# Patient Record
Sex: Male | Born: 1975
Health system: Southern US, Community
[De-identification: ages and names within clinical notes are randomized; demographics above are authoritative.]

---

## 2011-04-16 ENCOUNTER — Emergency Department (INDEPENDENT_AMBULATORY_CARE_PROVIDER_SITE_OTHER): Payer: Managed Care, Other (non HMO)

## 2011-04-16 ENCOUNTER — Encounter: Payer: Self-pay | Admitting: *Deleted

## 2011-04-16 ENCOUNTER — Emergency Department (INDEPENDENT_AMBULATORY_CARE_PROVIDER_SITE_OTHER)
Admission: EM | Admit: 2011-04-16 | Discharge: 2011-04-16 | Disposition: A | Discharge status: Home / Self Care | Payer: Managed Care, Other (non HMO) | Source: Home / Self Care | Attending: Family Medicine | Admitting: Family Medicine

## 2011-04-16 DIAGNOSIS — S3992XA Unspecified injury of lower back, initial encounter: Secondary | ICD-10-CM

## 2011-04-16 DIAGNOSIS — S21209A Unspecified open wound of unspecified back wall of thorax without penetration into thoracic cavity, initial encounter: Secondary | ICD-10-CM

## 2011-04-16 MED ORDER — CHLORZOXAZONE 250 MG PO TABS: 250.0000 mg | ORAL_TABLET | Freq: Four times a day (QID) | ORAL | Status: AC | PRN

## 2011-04-16 MED ORDER — IBUPROFEN 800 MG PO TABS: 800.0000 mg | ORAL_TABLET | Freq: Three times a day (TID) | ORAL | Status: AC

## 2011-04-16 NOTE — ED Provider Notes (Addendum)
History     CSN: [id number] Arrival date & time: 04/16/2011  6:52 PM   First MD Initiated Contact with Patient 04/16/11 1818      Chief Complaint  Patient presents with  . Shoulder Pain    (Consider location/radiation/quality/duration/timing/severity/associated sxs/prior treatment) Patient is a 35 y.o. male presenting with shoulder pain. The history is provided by the patient.  Shoulder Pain This is a new problem. Episode onset: reaching overhead 2 mos ago, felt right upper back pain, given pain med by company md, sx continue worse last eve into neck. The problem has been gradually worsening. Exacerbated by: reaching overhead. The symptoms are relieved by nothing.    History reviewed. No pertinent past medical history.  History reviewed. No pertinent past surgical history.  Family History  Problem Relation Age of Onset  . Hypertension Father     History  Substance Use Topics  . Smoking status: Never Smoker   . Smokeless tobacco: Not on file  . Alcohol Use: Yes     Socially      Review of Systems  Allergies  Review of patient's allergies indicates no known allergies.  Home Medications   Current Outpatient Rx  Name Route Sig Dispense Refill  . CYCLOBENZAPRINE HCL 5 MG PO TABS Oral Take 5 mg by mouth 3 (three) times daily as needed.        BP 141/93  Pulse 76  Temp(Src) 98.5 F (36.9 C) (Oral)  Resp 18  SpO2 100%  Physical Exam  Constitutional: He is oriented to person, place, and time. He appears well-developed and well-nourished.  HENT:  Head: Normocephalic and atraumatic.  Neck: Normal range of motion. Neck supple.  Cardiovascular: Normal rate, regular rhythm, normal heart sounds and intact distal pulses.   Pulmonary/Chest: Effort normal and breath sounds normal.  Musculoskeletal: Normal range of motion. He exhibits tenderness.       Arms: Lymphadenopathy:    He has no cervical adenopathy.  Neurological: He is alert and oriented to person, place,  and time. He has normal strength. No sensory deficit.    ED Course  Procedures (including critical care time)  Labs Reviewed - No data to display No results found.   No diagnosis found.    MDM   X-rays reviewed and report per radiologist.        Barkley Bruns, MD 04/16/11 2041  Barkley Bruns, MD 04/16/11 2113

## 2011-04-16 NOTE — ED Notes (Signed)
Pt  Reports  Pain  r  Arm  /  Shoulder  Radiating  To  Neck     Pt  Reports  Prior  Injury  To  That  Area  But the  Pain  That  Radiates  toward  The  Neck  Started  Last pm     denys  Any  Recent  specfic   Injury        Pain worse  On movement

## 2011-08-15 ENCOUNTER — Emergency Department (HOSPITAL_COMMUNITY)
Admission: EM | Admit: 2011-08-15 | Discharge: 2011-08-15 | Disposition: A | Discharge status: Home / Self Care | Payer: Managed Care, Other (non HMO) | Attending: Emergency Medicine | Admitting: Emergency Medicine

## 2011-08-15 ENCOUNTER — Emergency Department (HOSPITAL_COMMUNITY): Payer: Managed Care, Other (non HMO)

## 2011-08-15 ENCOUNTER — Encounter (HOSPITAL_COMMUNITY): Payer: Self-pay | Admitting: *Deleted

## 2011-08-15 DIAGNOSIS — R51 Headache: Secondary | ICD-10-CM

## 2011-08-15 DIAGNOSIS — J019 Acute sinusitis, unspecified: Secondary | ICD-10-CM | POA: Insufficient documentation

## 2011-08-15 DIAGNOSIS — F17200 Nicotine dependence, unspecified, uncomplicated: Secondary | ICD-10-CM | POA: Insufficient documentation

## 2011-08-15 MED ORDER — HYDROCODONE-ACETAMINOPHEN 5-500 MG PO TABS: 1.0000 | ORAL_TABLET | Freq: Four times a day (QID) | ORAL | Status: AC | PRN

## 2011-08-15 MED ORDER — AZITHROMYCIN 250 MG PO TABS: 250.0000 mg | ORAL_TABLET | Freq: Every day | ORAL | Status: AC

## 2011-08-15 NOTE — ED Provider Notes (Addendum)
History     CSN: [id number]  Arrival date & time 08/15/11  1145   First MD Initiated Contact with Patient 08/15/11 1217      Chief Complaint  Patient presents with  . Headache    (Consider location/radiation/quality/duration/timing/severity/associated sxs/prior treatment) Patient is a 36 y.o. male presenting with headaches. The history is provided by the patient.  Headache  This is a new problem. Episode onset: 3 weeks ago. The problem occurs constantly. The problem has been gradually worsening. The headache is associated with nothing. The pain is located in the frontal region. The pain is moderate. The pain does not radiate. The treatment provided mild relief.    History reviewed. No pertinent past medical history.  History reviewed. No pertinent past surgical history.  Family History  Problem Relation Age of Onset  . Hypertension Father     History  Substance Use Topics  . Smoking status: Current Everyday Smoker    Types: Cigarettes  . Smokeless tobacco: Not on file  . Alcohol Use: Yes     Socially      Review of Systems  Neurological: Positive for headaches.  All other systems reviewed and are negative.    Allergies  Review of patient's allergies indicates no known allergies.  Home Medications   Current Outpatient Rx  Name Route Sig Dispense Refill  . IBUPROFEN 200 MG PO TABS Oral Take 200 mg by mouth every 6 (six) hours as needed. For headache      BP 116/81  Pulse 110  Temp(Src) 98.4 F (36.9 C) (Oral)  Resp 16  SpO2 95%  Physical Exam  Nursing note and vitals reviewed. Constitutional: He is oriented to person, place, and time. He appears well-developed and well-nourished. No distress.  HENT:  Head: Normocephalic and atraumatic.  Eyes: EOM are normal. Pupils are equal, round, and reactive to light.  Neck: Normal range of motion. Neck supple.  Cardiovascular: Normal rate and regular rhythm.   No murmur heard. Pulmonary/Chest: Effort normal  and breath sounds normal. No respiratory distress.  Abdominal: Soft. Bowel sounds are normal. He exhibits no distension. There is no tenderness.  Musculoskeletal: Normal range of motion. He exhibits no edema.  Lymphadenopathy:    He has no cervical adenopathy.  Neurological: He is alert and oriented to person, place, and time. No cranial nerve deficit. He exhibits normal muscle tone. Coordination normal.  Skin: Skin is warm and dry. He is not diaphoretic.    ED Course  Procedures (including critical care time)  Labs Reviewed - No data to display No results found.   No diagnosis found.    MDM    The CT looks okay with the exception of maxillary sinus mucosal thickening.  Will treat with antibiotics, pain meds.      Geoffery Lyons, MD 08/15/11 1241  Geoffery Lyons, MD 08/15/11 1344  Geoffery Lyons, MD 09/09/11 Ernestina [place]

## 2011-08-15 NOTE — ED Notes (Signed)
Pt reports having intermittent severe headache x 3 weeks, is temporarily relieved with ibuprofen, thought it was due to htn but bp is 116/81 at triage, no acute distress noted at triage.

## 2011-08-15 NOTE — Discharge Instructions (Signed)
 Headache, General, Unknown Cause   The specific cause of your headache may not have been found today. There are many causes and types of headache. A few common ones are:   Tension headache.   Migraine.   Infections (examples: dental and sinus infections).   Bone and/or joint problems in the neck or jaw.   Depression.   Eye problems.  These headaches are not life threatening.   Headaches can sometimes be diagnosed by a patient history and a physical exam. Sometimes, lab and imaging studies (such as x-ray and/or CT scan) are used to rule out more serious problems. In some cases, a spinal tap (lumbar puncture) may be requested. There are many times when your exam and tests may be normal on the first visit even when there is a serious problem causing your headaches. Because of that, it is very important to follow up with your doctor or local clinic for further evaluation.   FINDING OUT THE RESULTS OF TESTS   If a radiology test was performed, a radiologist will review your results.   You will be contacted by the emergency department or your physician if any test results require a change in your treatment plan.   Not all test results may be available during your visit. If your test results are not back during the visit, make an appointment with your caregiver to find out the results. Do not assume everything is normal if you have not heard from your caregiver or the medical facility. It is important for you to follow up on all of your test results.  HOME CARE INSTRUCTIONS   Keep follow-up appointments with your caregiver, or any specialist referral.   Only take over-the-counter or prescription medicines for pain, discomfort, or fever as directed by your caregiver.   Biofeedback, massage, or other relaxation techniques may be helpful.   Ice packs or heat applied to the head and neck can be used. Do this three to four times per day, or as needed.   Call your doctor if you have any questions or concerns.   If you smoke, you  should quit.  SEEK MEDICAL CARE IF:   You develop problems with medications prescribed.   You do not respond to or obtain relief from medications.   You have a change from the usual headache.   You develop nausea or vomiting.  SEEK IMMEDIATE MEDICAL CARE IF:   If your headache becomes severe.   You have an unexplained oral temperature above 102 F (38.9 C), or as your caregiver suggests.   You have a stiff neck.   You have loss of vision.   You have muscular weakness.   You have loss of muscular control.   You develop severe symptoms different from your first symptoms.   You start losing your balance or have trouble walking.   You feel faint or pass out.  MAKE SURE YOU:   Understand these instructions.   Will watch your condition.   Will get help right away if you are not doing well or get worse.  Document Released: 05/18/2005 Document Revised: 05/07/2011 Document Reviewed: 01/05/2008   [facility] Patient Information 2012 [place], [place].

## 2012-02-15 ENCOUNTER — Encounter (HOSPITAL_COMMUNITY): Payer: Self-pay

## 2012-02-15 ENCOUNTER — Emergency Department (HOSPITAL_COMMUNITY)
Admission: EM | Admit: 2012-02-15 | Discharge: 2012-02-16 | Disposition: A | Discharge status: Home Health | Payer: Self-pay | Attending: Emergency Medicine | Admitting: Emergency Medicine

## 2012-02-15 DIAGNOSIS — S39012A Strain of muscle, fascia and tendon of lower back, initial encounter: Secondary | ICD-10-CM

## 2012-02-15 DIAGNOSIS — F17200 Nicotine dependence, unspecified, uncomplicated: Secondary | ICD-10-CM | POA: Insufficient documentation

## 2012-02-15 DIAGNOSIS — W1789XA Other fall from one level to another, initial encounter: Secondary | ICD-10-CM | POA: Insufficient documentation

## 2012-02-15 DIAGNOSIS — S339XXA Sprain of unspecified parts of lumbar spine and pelvis, initial encounter: Secondary | ICD-10-CM | POA: Insufficient documentation

## 2012-02-15 MED ORDER — OXYCODONE-ACETAMINOPHEN 5-325 MG PO TABS
1.0000 | ORAL_TABLET | Freq: Once | ORAL | Status: AC
  Administered 2012-02-15: 1 via ORAL
  Filled 2012-02-15: qty 1

## 2012-02-15 MED ORDER — DIAZEPAM 5 MG PO TABS
5.0000 mg | ORAL_TABLET | Freq: Once | ORAL | Status: AC
  Administered 2012-02-15: 5 mg via ORAL
  Filled 2012-02-15: qty 1

## 2012-02-15 NOTE — ED Notes (Signed)
Pt reports lower back pain x2 weeks, pt reports after pain started he fell off of a pallet jack landing on his buttocks. Pt denies problems w/bowel or bladder function

## 2012-02-15 NOTE — ED Provider Notes (Signed)
History     CSN: [id number]  Arrival date & time 02/15/12  2115   First MD Initiated Contact with Patient 02/15/12 2340      Chief Complaint  Patient presents with  . Back Pain    (Consider location/radiation/quality/duration/timing/severity/associated sxs/prior treatment) HPI Comments: Patient states, that 2 weeks, ago.  He fell off a [employer]product [employer]" falling onto his buttock.  He has had pain in his lumbar spine and right flank area.  Ever, since he's been taking over-the-counter ibuprofen, 6-800 mg 3-4 times a day without any relief  Patient is a 36 y.o. male presenting with back pain. The history is provided by the patient.  Back Pain  This is a new problem. The current episode started more than 1 week ago. The problem occurs constantly. The problem has not changed since onset.The pain is associated with falling. The pain is present in the lumbar spine. The quality of the pain is described as aching. The pain does not radiate. The pain is at a severity of 9/10. The pain is moderate. The symptoms are aggravated by bending, twisting and certain positions. Pertinent negatives include no fever, no numbness, no headaches, no abdominal pain, no dysuria, no leg pain and no weakness.    History reviewed. No pertinent past medical history.  History reviewed. No pertinent past surgical history.  Family History  Problem Relation Age of Onset  . Hypertension Father     History  Substance Use Topics  . Smoking status: Current Every Day Smoker    Types: Cigarettes  . Smokeless tobacco: Not on file  . Alcohol Use: Yes     Socially      Review of Systems  Constitutional: Negative for fever and chills.  HENT: Negative for neck pain.   Gastrointestinal: Negative for abdominal pain.  Genitourinary: Negative for dysuria.  Musculoskeletal: Positive for back pain.  Skin: Negative for rash and wound.  Neurological: Negative for dizziness, weakness, numbness and headaches.    Allergies    Review of patient's allergies indicates no known allergies.  Home Medications   Current Outpatient Rx  Name Route Sig Dispense Refill  . IBUPROFEN 800 MG PO TABS Oral Take 800 mg by mouth daily as needed. For pain    . DIAZEPAM 5 MG PO TABS Oral Take 1 tablet (5 mg total) by mouth every 8 (eight) hours as needed for anxiety. 30 tablet 0  . HYDROCODONE-ACETAMINOPHEN 5-500 MG PO TABS Oral Take 1 tablet by mouth every 6 (six) hours as needed for pain. 15 tablet 0    BP 138/85  Pulse 107  Temp 98.4 F (36.9 C) (Oral)  Resp 18  SpO2 97%  Physical Exam  Constitutional: He appears well-developed and well-nourished.  HENT:  Head: Normocephalic.  Eyes: Pupils are equal, round, and reactive to light.  Cardiovascular: Normal rate.   Musculoskeletal: He exhibits no edema.       Lumbar back: He exhibits decreased range of motion, tenderness and pain. He exhibits no bony tenderness and no swelling.       Back:  Neurological: He is alert.  Skin: Skin is warm.    ED Course  Procedures (including critical care time)  Labs Reviewed - No data to display Dg Lumbar Spine Complete  02/16/2012  *RADIOLOGY REPORT*  Clinical Data: Low back pain for 2 weeks.  LUMBAR SPINE - COMPLETE 4+ VIEW  Comparison: None.  Findings: Vertebral body height and alignment are normal.  No pars particulars defect is identified.  Intervertebral disc space height is maintained.  Paraspinous structures unremarkable.  IMPRESSION: Negative study.   Original Report Authenticated By: Bernadene Bell. D'ALESSIO, M.D.      1. Strain, lumbosacral       MDM   We'll x-ray looking for potential, compression fracture, although this is highly unlikely.  Most likely a muscle strain in the lumbosacral area  Patient states, that Valium is ineffective for him.  He would rather have Flexeril       Arman Filter, NP 02/16/12 0034  Arman Filter, NP 02/16/12 0034  Arman Filter, NP 02/16/12 0045

## 2012-02-16 ENCOUNTER — Emergency Department (HOSPITAL_COMMUNITY): Payer: Self-pay

## 2012-02-16 MED ORDER — DIAZEPAM 5 MG PO TABS: 5.0000 mg | ORAL_TABLET | Freq: Three times a day (TID) | ORAL | Status: DC | PRN

## 2012-02-16 MED ORDER — HYDROCODONE-ACETAMINOPHEN 5-500 MG PO TABS: 1.0000 | ORAL_TABLET | Freq: Four times a day (QID) | ORAL | Status: DC | PRN

## 2012-02-16 MED ORDER — CYCLOBENZAPRINE HCL 10 MG PO TABS: 5.0000 mg | ORAL_TABLET | Freq: Two times a day (BID) | ORAL | Status: DC | PRN

## 2012-02-16 NOTE — ED Notes (Signed)
Pt refused wheelchair for discharge. Pt discharged home in good condition.

## 2012-03-01 ENCOUNTER — Encounter (HOSPITAL_COMMUNITY): Payer: Self-pay | Admitting: *Deleted

## 2012-03-01 ENCOUNTER — Emergency Department (HOSPITAL_COMMUNITY): Payer: Self-pay

## 2012-03-01 ENCOUNTER — Emergency Department (HOSPITAL_COMMUNITY)
Admission: EM | Admit: 2012-03-01 | Discharge: 2012-03-01 | Disposition: A | Discharge status: Home / Self Care | Payer: Self-pay | Attending: Emergency Medicine | Admitting: Emergency Medicine

## 2012-03-01 DIAGNOSIS — W108XXA Fall (on) (from) other stairs and steps, initial encounter: Secondary | ICD-10-CM | POA: Insufficient documentation

## 2012-03-01 DIAGNOSIS — S63509A Unspecified sprain of unspecified wrist, initial encounter: Secondary | ICD-10-CM | POA: Insufficient documentation

## 2012-03-01 DIAGNOSIS — W19XXXA Unspecified fall, initial encounter: Secondary | ICD-10-CM

## 2012-03-01 DIAGNOSIS — Y92009 Unspecified place in unspecified non-institutional (private) residence as the place of occurrence of the external cause: Secondary | ICD-10-CM | POA: Insufficient documentation

## 2012-03-01 DIAGNOSIS — F17200 Nicotine dependence, unspecified, uncomplicated: Secondary | ICD-10-CM | POA: Insufficient documentation

## 2012-03-01 MED ORDER — OXYCODONE-ACETAMINOPHEN 5-325 MG PO TABS
1.0000 | ORAL_TABLET | Freq: Once | ORAL | Status: AC
  Administered 2012-03-01: 1 via ORAL
  Filled 2012-03-01: qty 1

## 2012-03-01 MED ORDER — IBUPROFEN 800 MG PO TABS
800.0000 mg | ORAL_TABLET | Freq: Once | ORAL | Status: AC
  Administered 2012-03-01: 800 mg via ORAL
  Filled 2012-03-01: qty 1

## 2012-03-01 MED ORDER — IBUPROFEN 600 MG PO TABS: 600.0000 mg | ORAL_TABLET | Freq: Four times a day (QID) | ORAL | Status: DC | PRN

## 2012-03-01 NOTE — ED Notes (Signed)
Pt fell forward last night and caught weight  on rt wrist.  Pt claims wrist was swollen and he has been applying ice.  Pt states his palm is also swollen.  Rn noted decreased ROM.  Pt alert oriented X4

## 2012-03-01 NOTE — ED Provider Notes (Signed)
History     CSN: [id number]  Arrival date & time 03/01/12  7829   First MD Initiated Contact with Patient 03/01/12 1004      Chief Complaint  Patient presents with  . Wrist Pain    (Consider location/radiation/quality/duration/timing/severity/associated sxs/prior treatment) HPI [place] Looney is a 36 y.o. male who was walking up the stairs to his home last night fell on an outstretched hand and injured his right wrist. He said some minor swelling to the wrist, 10/10 pain, sharp, does not radiate, he's had good sensation in the hand he has not had any weakness in the hand. Did not sustain any lacerations.    History reviewed. No pertinent past medical history.  History reviewed. No pertinent past surgical history.  Family History  Problem Relation Age of Onset  . Hypertension Father     History  Substance Use Topics  . Smoking status: Current Every Day Smoker    Types: Cigarettes  . Smokeless tobacco: Not on file  . Alcohol Use: Yes     Socially      Review of Systems At least 10pt or greater review of systems completed and are negative except where specified in the HPI.  Allergies  Review of patient's allergies indicates no known allergies.  Home Medications   Current Outpatient Rx  Name Route Sig Dispense Refill  . CYCLOBENZAPRINE HCL 10 MG PO TABS Oral Take 10 mg by mouth 3 (three) times daily as needed. Back spasms    . HYDROCODONE-ACETAMINOPHEN 5-500 MG PO TABS Oral Take 1 tablet by mouth every 6 (six) hours as needed for pain. 15 tablet 0  . IBUPROFEN 600 MG PO TABS Oral Take 600 mg by mouth every 6 (six) hours as needed. pain    . IBUPROFEN 600 MG PO TABS Oral Take 1 tablet (600 mg total) by mouth every 6 (six) hours as needed for pain. 30 tablet 0    BP 129/79  Pulse 105  Temp 98.2 F (36.8 C) (Oral)  Resp 18  SpO2 95%  Physical Exam  Nursing notes reviewed.  Electronic medical record reviewed. VITAL SIGNS:   Filed Vitals:   03/01/12 0951  BP:  129/79  Pulse: 105  Temp: 98.2 F (36.8 C)  TempSrc: Oral  Resp: 18  SpO2: 95%   CONSTITUTIONAL: Awake, oriented, appears non-toxic HENT: Atraumatic, normocephalic, oral mucosa pink and moist, airway patent. Nares patent without drainage. External ears normal. EYES: Conjunctiva clear, EOMI, PERRLA NECK: Trachea midline, non-tender, supple CARDIOVASCULAR: Normal heart rate, Normal rhythm, No murmurs, rubs, gallops PULMONARY/CHEST: Clear to auscultation, no rhonchi, wheezes, or rales. Symmetrical breath sounds. Non-tender. ABDOMINAL: Non-distended, soft, non-tender - no rebound or guarding.  BS normal. NEUROLOGIC: Non-focal, moving all four extremities, no gross sensory or motor deficits. EXTREMITIES: No clubbing, cyanosis, or edema. Mild swelling to right wrist, mildly tender to palpation, no anatomic snuffbox tenderness. Patient has 5 out of 5 strength to grip, abduction and adduction. Patient has limited range of motion at wrist secondary flexion and extension due to pain. SKIN: Warm, Dry, No erythema, No rash  ED Course  Procedures (including critical care time)  Labs Reviewed - No data to display Dg Wrist Complete Right  03/01/2012  *RADIOLOGY REPORT*  Clinical Data: Pain post fall  RIGHT WRIST - COMPLETE 3+ VIEW  Comparison: None.  Findings: Four views of the right wrist submitted.  No acute fracture or subluxation.  No radiopaque foreign body.  IMPRESSION: No acute fracture or subluxation.   Original  Report Authenticated By: Natasha Mead, M.D.     1. Wrist sprain   2. Fall     MDM  [place] Biever is a 36 y.o. male presenting with a right wrist injury after falling last night. X-ray obtained in triage shows no acute fracture. I'm not concerned for a Occult scaphoid/carpal fracture at this time as he has no snuff box tenderness in most of his pain is over distal radius.  Patient be given pain medicine and discharged home in good condition.          Jones Skene, MD 03/03/12  1610

## 2012-03-01 NOTE — Progress Notes (Signed)
Orthopedic Tech Progress Note Patient Details:  Bryan Cobb Dec 24, 1975 [id number]  Ortho Devices Type of Ortho Device: Velcro wrist splint Ortho Device/Splint Location: right velcro wrist splint Ortho Device/Splint Interventions: Application   Cammer, Mickie Bail 03/01/2012, 10:44 AM

## 2012-03-01 NOTE — ED Notes (Signed)
Pt states fell last nite and injured right wrist

## 2012-09-23 ENCOUNTER — Emergency Department (HOSPITAL_COMMUNITY)
Admission: EM | Admit: 2012-09-23 | Discharge: 2012-09-23 | Discharge status: Against Medical Advice | Payer: Self-pay | Attending: Emergency Medicine | Admitting: Emergency Medicine

## 2012-09-23 ENCOUNTER — Encounter (HOSPITAL_COMMUNITY): Payer: Self-pay | Admitting: *Deleted

## 2012-09-23 DIAGNOSIS — F17200 Nicotine dependence, unspecified, uncomplicated: Secondary | ICD-10-CM | POA: Insufficient documentation

## 2012-09-23 DIAGNOSIS — M25559 Pain in unspecified hip: Secondary | ICD-10-CM | POA: Insufficient documentation

## 2012-09-23 NOTE — ED Notes (Signed)
Pt c/o extreme hip pain that has been worsening x 3 weeks.  States he had a forklift injury, no broken bones.  Has been going to physical therapy.  C/o left "buttocks and hip".  Ambulatory, hurts to sit.

## 2013-05-15 ENCOUNTER — Encounter (HOSPITAL_COMMUNITY): Payer: Self-pay | Admitting: Emergency Medicine

## 2013-05-15 ENCOUNTER — Emergency Department (HOSPITAL_COMMUNITY)
Admission: EM | Admit: 2013-05-15 | Discharge: 2013-05-15 | Disposition: A | Discharge status: Home / Self Care | Payer: Managed Care, Other (non HMO) | Attending: Emergency Medicine | Admitting: Emergency Medicine

## 2013-05-15 ENCOUNTER — Emergency Department (HOSPITAL_COMMUNITY): Payer: Managed Care, Other (non HMO)

## 2013-05-15 DIAGNOSIS — F17200 Nicotine dependence, unspecified, uncomplicated: Secondary | ICD-10-CM | POA: Insufficient documentation

## 2013-05-15 DIAGNOSIS — R072 Precordial pain: Secondary | ICD-10-CM | POA: Insufficient documentation

## 2013-05-15 DIAGNOSIS — R079 Chest pain, unspecified: Secondary | ICD-10-CM

## 2013-05-15 LAB — CBC
MCHC: 35.6 g/dL (ref 30.0–36.0)
Platelets: 266 10*3/uL (ref 150–400)
RDW: 11.9 % (ref 11.5–15.5)
WBC: 5.7 10*3/uL (ref 4.0–10.5)

## 2013-05-15 LAB — BASIC METABOLIC PANEL
Chloride: 102 mEq/L (ref 96–112)
GFR calc Af Amer: >90 mL/min (ref >90)
GFR calc non Af Amer: 86 mL/min — ABNORMAL LOW (ref >90)
Potassium: 3.3 mEq/L — ABNORMAL LOW (ref 3.5–5.1)
Sodium: 140 mEq/L (ref 135–145)

## 2013-05-15 LAB — TROPONIN I: Troponin I: <0.3 ng/mL

## 2013-05-15 MED ORDER — POTASSIUM CHLORIDE CRYS ER 20 MEQ PO TBCR
40.0000 meq | EXTENDED_RELEASE_TABLET | Freq: Once | ORAL | Status: AC
  Administered 2013-05-15: 40 meq via ORAL
  Filled 2013-05-15: qty 2

## 2013-05-15 MED ORDER — ACETAMINOPHEN 325 MG PO TABS
650.0000 mg | ORAL_TABLET | Freq: Once | ORAL | Status: AC
  Administered 2013-05-15: 650 mg via ORAL
  Filled 2013-05-15: qty 2

## 2013-05-15 NOTE — ED Provider Notes (Signed)
CSN: [id number]     Arrival date & time 05/15/13  1714 History   None    Chief Complaint  Patient presents with  . Chest Pain   (Consider location/radiation/quality/duration/timing/severity/associated sxs/prior Treatment) HPI Mr. Bryan Cobb is a 37 y.o. y/o male w/ no significant PMHx, presents to the ED w/ complaints of chest pain. The patient claims this pain started yesterday, describes as substernal/epigastric pain, sharp in nature, 7/10 in severity, and worse with movement and deep inspiration. Reproducible w/ palpation. He claims this pain started in his sleep and denies any associated SOB, nausea, vomiting, fever, chills, cough, sore throat, dizziness, lightheadedness, diaphoresis, and palpitations. The patient also denies any recent prolonged immobilization, recent surgeries, or previous history of DVT or PE.  History reviewed. No pertinent past medical history. History reviewed. No pertinent past surgical history. Family History  Problem Relation Age of Onset  . Hypertension Father    History  Substance Use Topics  . Smoking status: Current Every Day Smoker -- 0.25 packs/day    Types: Cigarettes  . Smokeless tobacco: Not on file  . Alcohol Use: Yes     Comment: Socially    Review of Systems General: Denies fever, chills, diaphoresis, appetite change and fatigue.  Respiratory: Denies SOB, DOE, cough, chest tightness, and wheezing.   Cardiovascular: Positive for chest pain. Denies palpitations and leg swelling.  Gastrointestinal: Denies nausea, vomiting, abdominal pain, diarrhea, constipation, blood in stool and abdominal distention.  Genitourinary: Denies dysuria, urgency, frequency, hematuria, flank pain and difficulty urinating.  Endocrine: Denies hot or cold intolerance, sweats, polyuria, polydipsia. Musculoskeletal: Denies myalgias, back pain, joint swelling, arthralgias and gait problem.  Skin: Denies pallor, rash and wounds.  Neurological: Denies dizziness,  seizures, syncope, weakness, lightheadedness, numbness and headaches.  Psychiatric/Behavioral: Denies mood changes, confusion, nervousness, sleep disturbance and agitation.  Allergies  Review of patient's allergies indicates no known allergies.  Home Medications   Current Outpatient Rx  Name  Route  Sig  Dispense  Refill  . traMADol (ULTRAM) 50 MG tablet   Oral   Take 50 mg by mouth every 6 (six) hours as needed.          Physical Exam Filed Vitals:   05/15/13 1722 05/15/13 2128  BP: 144/90 144/82  Pulse: 94 75  Temp: 98.1 F (36.7 C) 98.1 F (36.7 C)  TempSrc: Oral Oral  Resp: 16 17  Height: 5\' 9"  (1.753 m)   Weight: 186 lb 1.6 oz (84.414 kg)   SpO2: 97% 100%  General: Vital signs reviewed.  Patient is a well-developed and well-nourished, in no acute distress and cooperative with exam. Alert and oriented x3.  Head: Normocephalic and atraumatic. Eyes: PERRL, EOMI, conjunctivae normal, No scleral icterus.  Neck: Supple, trachea midline, normal ROM, No JVD, masses, thyromegaly, or carotid bruit present.  Cardiovascular: RRR, S1 normal, S2 normal, no murmurs, gallops, or rubs. Pulmonary/Chest: Normal respiratory effort, CTAB, no wheezes, rales, or rhonchi. Mid-sternal chest pain reproducible w/ palpation. Abdominal: Soft, non-tender, non-distended, bowel sounds are normal, no masses, organomegaly, or guarding present.  Musculoskeletal: No joint deformities, erythema, or stiffness, ROM full and no nontender. Extremities: No swelling or edema,  pulses symmetric and intact bilaterally. No cyanosis or clubbing. Neurological: A&O x3, Strength is normal and symmetric bilaterally, cranial nerve II-XII are grossly intact, no focal motor deficit, sensory intact to light touch bilaterally.  Skin: Warm, dry and intact. No rashes or erythema. Psychiatric: Normal mood and affect. speech and behavior is normal. Cognition and memory  are normal.    ED Course  Procedures (including critical  care time) Labs Review Labs Reviewed  CBC - Abnormal; Notable for the following:    HCT 38.8 (*)    All other components within normal limits  BASIC METABOLIC PANEL - Abnormal; Notable for the following:    Potassium 3.3 (*)    GFR calc non Af Amer 86 (*)    All other components within normal limits  TROPONIN I   Imaging Review Dg Chest 2 View  05/15/2013   CLINICAL DATA:  Tightness in chest.  EXAM: CHEST  2 VIEW  COMPARISON:  04/16/2011.  FINDINGS: Mediastinum and hilar structures are normal. Heart size and pulmonary vascularity normal. Platelike atelectasis both lung bases. No pleural effusion or pneumothorax. No acute bony abnormality.  IMPRESSION: Platelike atelectasis both lung bases. Followup chest x-rays to demonstrate clearing suggested .   Electronically Signed   By: Maisie Fus  Register   On: 05/15/2013 18:15    EKG Interpretation   None       MDM   Mr. Bryan Cobb is a 37 y.o. y/o male w/ no significant PMHx, presents to the ED w/ complaints of chest pain. Unlikely ACS at this time given atypical presentation and age. Pain is reproducible w/ palpation, worse w/ deep inspiration and movement. No recent history of immobilization, recent surgery, or history of DVT/PE. No tachycardia. Well's Criteria and Revised Geneva score low risk for PE. No fever or chills, or cough, pneumonia unlikely. Also considered pancreatitis, GERD, and PUD, however, patient denies a significant history alcohol use and pain is unrelated to food intake. No history of NSAID use.  -EKG shows NSR @ 95, non-specific ST/T wave abnormalities.  -Troponin x1 -ve -CBC wnl -BMET w/ K of 3.3; give K-dur 40 mEq -CXR shows a platelike atelectasis both lung bases, unchanged from previous CXR. Suggest follow up in future to determine if clearing.   Patient stable for discharge at this time. Advised patient to [facility] to establish PCP in 2-4 weeks.    Courtney Paris,  MD 05/15/13 2248

## 2013-05-15 NOTE — ED Notes (Signed)
PT states yesterday he awoke out of his sleep with chest pain.  Denies nausea, diaphoresis or sob.  Denies injury or change in normal behaviors.  Denies cough but feels as if he's wheezing.  Pain increases when he leans forward or lays on his back or side.  Pain also increases when he laughs.

## 2013-05-18 NOTE — ED Provider Notes (Signed)
I saw and evaluated the patient, reviewed the resident's note and I agree with the findings and plan.   .Face to face Exam:  General:  Awake HEENT:  Atraumatic Resp:  Normal effort Abd:  Nondistended Neuro:No focal weakness  Pain had been constant and is reproducible.Low likelyhood of ACS in light of hx, ecg and negative troponin.   I reviewed the EKG and agree with interpretation in chart  Nelia Shi, MD 05/18/13 1056

## 2014-12-04 ENCOUNTER — Encounter (HOSPITAL_COMMUNITY): Payer: Self-pay | Admitting: Emergency Medicine

## 2014-12-04 ENCOUNTER — Emergency Department (HOSPITAL_COMMUNITY)
Admission: EM | Admit: 2014-12-04 | Discharge: 2014-12-04 | Disposition: A | Discharge status: Home / Self Care | Payer: 59 | Attending: Emergency Medicine | Admitting: Emergency Medicine

## 2014-12-04 ENCOUNTER — Emergency Department (HOSPITAL_COMMUNITY): Payer: 59

## 2014-12-04 DIAGNOSIS — Y9367 Activity, basketball: Secondary | ICD-10-CM | POA: Diagnosis not present

## 2014-12-04 DIAGNOSIS — Y999 Unspecified external cause status: Secondary | ICD-10-CM | POA: Insufficient documentation

## 2014-12-04 DIAGNOSIS — Y929 Unspecified place or not applicable: Secondary | ICD-10-CM | POA: Insufficient documentation

## 2014-12-04 DIAGNOSIS — M20011 Mallet finger of right finger(s): Secondary | ICD-10-CM | POA: Insufficient documentation

## 2014-12-04 DIAGNOSIS — W1839XA Other fall on same level, initial encounter: Secondary | ICD-10-CM | POA: Diagnosis not present

## 2014-12-04 DIAGNOSIS — S6991XA Unspecified injury of right wrist, hand and finger(s), initial encounter: Secondary | ICD-10-CM | POA: Diagnosis present

## 2014-12-04 MED ORDER — HYDROCODONE-ACETAMINOPHEN 5-325 MG PO TABS: 2.0000 | ORAL_TABLET | ORAL | Status: DC | PRN

## 2014-12-04 MED ORDER — IBUPROFEN 800 MG PO TABS: 800.0000 mg | ORAL_TABLET | Freq: Three times a day (TID) | ORAL | Status: DC | PRN

## 2014-12-04 NOTE — Discharge Instructions (Signed)
Read the information below.  Use the prescribed medication as directed.  Please discuss all new medications with your pharmacist.  Do not take additional tylenol while taking the prescribed pain medication to avoid overdose.  You may return to the Emergency Department at any time for worsening condition or any new symptoms that concern you.   If you develop uncontrolled pain, weakness or numbness of the extremity, severe discoloration of the skin, or you are unable to move your finger, return to the ER for a recheck.      Mallet Finger A mallet, or jammed, finger occurs when the end of a straightened finger or thumb receives a blow (often from a ball). This causes a disruption (tearing) of the extensor tendon (cord like structure which attaches muscle to bone) that straightens the end of your finger. The last joint in your finger will droop and you cannot extend it. Sometimes this is associated with a small fracture (break in bone) of the base of the end bone (phalanx) in your finger. It usually takes 4 to 5 weeks to heal. HOME CARE INSTRUCTIONS   Apply ice to the sore finger for 15-20 minutes, 03-04 times per day for 2 days. Put the ice in a plastic bag and place a towel between the bag of ice and your skin.  If you have a finger splint, wear your splint as directed.  You may remove the splint to wash your finger or as directed.  If your splint is off, do not try to bend the tip of your finger.  Put your splint back on as soon as possible. If your finger is numb or tingling, the splint is probably too tight. You can loosen it so it is comfortable.  Move the part of your injured finger that is not covered by the splint several times a day.  Take medications as directed by your caregiver. Only take over-the-counter or prescription medicines for pain, discomfort, or fever as directed by your caregiver.  IMPORTANT: follow up with your caregiver or keep or call for any appointments with specialists  as directed. The failure to follow up could result in chronic pain and / or disability. SEEK MEDICAL CARE IF:   You have increased pain or swelling.  You notice coldness of your finger.  After treatment you still cannot extend your finger. SEEK IMMEDIATE MEDICAL CARE IF:  Your finger is swollen and very red, white, blue, numb, cold, or tingling. MAKE SURE YOU:   Understand these instructions.  Will watch your condition.  Will get help right away if you are not doing well or get worse. Document Released: 05/15/2000 Document Revised: 10/02/2013 Document Reviewed: 12/30/2007 [facility] Patient Information 2015 [place], [place]. This information is not intended to replace advice given to you by your health care provider. Make sure you discuss any questions you have with your health care provider.

## 2014-12-04 NOTE — ED Provider Notes (Signed)
CSN: [id number]     Arrival date & time 12/04/14  1630 History   This chart was scribed for Trixie DredgeEmily Emmilynn Marut, PA-C working with Arby BarretteMarcy Pfeiffer, MD by Elveria Risingimelie Horne, ED Scribe. This patient was seen in room WTR7/WTR7 and the patient's care was started at 5:32 PM.   Chief Complaint  Patient presents with  . Finger Injury    5th finger swollen after injury while playing basketball  . Back Pain    low back pain after falling    The history is provided by the patient. No language interpreter was used.   HPI Comments: Bryan Cobb is a 39 y.o. male who presents to the Emergency Department with multiple injuries sustained due falling during basketball game last night. Patient reports falling after jumping up to retrieve a rebound and landing with impact to lower back and he put his right hand back behind him to catch himself during the fall.. Patient denies striking his head or loss of consciousness. Patient complains of right fifth finger pain and swelling and lower back pain. Patient reports treatment with ibuprofen at work today.   Denies weakness or numbness, other injury.  Back pain is described as a mild soreness.  Pt operates heavy machinery at work.      History reviewed. No pertinent past medical history. History reviewed. No pertinent past surgical history. Family History  Problem Relation Age of Onset  . Hypertension Father    History  Substance Use Topics  . Smoking status: Never Smoker   . Smokeless tobacco: Not on file  . Alcohol Use: No    Review of Systems  Constitutional: Negative for fever and chills.  Cardiovascular: Negative for chest pain.  Gastrointestinal: Negative for abdominal pain.  Musculoskeletal: Positive for myalgias, back pain and arthralgias.  Skin: Negative for wound.  Allergic/Immunologic: Negative for immunocompromised state.  Neurological: Negative for syncope, weakness, numbness and headaches.  Hematological: Does not bruise/bleed easily.   Psychiatric/Behavioral: Positive for self-injury (accidental).      Allergies  Review of patient's allergies indicates no known allergies.  Home Medications   Prior to Admission medications   Medication Sig Start Date End Date Taking? Authorizing Provider  traMADol (ULTRAM) 50 MG tablet Take 50 mg by mouth every 6 (six) hours as needed.    Historical Provider, MD   Triage Vitals: BP 148/88 mmHg  Pulse 80  Temp(Src) 98.3 F (36.8 C) (Oral)  Resp 18  SpO2 100% Physical Exam  Constitutional: He appears well-developed and well-nourished. No distress.  HENT:  Head: Normocephalic and atraumatic.  Neck: Neck supple.  Pulmonary/Chest: Effort normal.  Musculoskeletal:  Spine nontender, no crepitus, or stepoffs. Right hand: fifth finger with tenderness, edema, mild erythema over the DIP. Held in slightly flexed position unable to full extend at DIP only. Full flexion of finger. Distal sensation intact. Cap refill <2 seconds.   Neurological: He is alert.  Skin: He is not diaphoretic.  Nursing note and vitals reviewed.   ED Course  Procedures (including critical care time)  COORDINATION OF CARE: 5:38 PM- Discussed treatment plan with patient at bedside and patient agreed to plan.   Labs Review Labs Reviewed - No data to display  Imaging Review Dg Hand Complete Right  12/04/2014   CLINICAL DATA:  Injury to the right fifth finger while playing basketball. Slight deformity at the distal interphalangeal joint. Initial encounter.  EXAM: RIGHT HAND - COMPLETE 3+ VIEW  COMPARISON:  Right wrist radiographs performed 03/01/2012  FINDINGS: There is  no evidence of fracture or dislocation. The joint spaces are preserved. The carpal rows are intact, and demonstrate normal alignment. The soft tissues are unremarkable in appearance.  IMPRESSION: No evidence of fracture or dislocation.   Electronically Signed   By: Roanna Raider M.D.   On: 12/04/2014 17:31     EKG Interpretation None       MDM   Final diagnoses:  Mallet finger of right hand   Afebrile, nontoxic patient with injury to his right 5th finger while falling backwards playing basketball.  Unable to fully extend 5th DIP joint.   Xray negative.  No skin opening.  Suspect mallet finger.  Placed in splint and advised to leave it on at all times.   D/C home with ibuprofen, norco, hand surgery follow up.   Discussed result, findings, treatment, and follow up  with patient.  Pt given return precautions.  Pt verbalizes understanding and agrees with plan.      I personally performed the services described in this documentation, which was scribed in my presence. The recorded information has been reviewed and is accurate.    Trixie Dredge, PA-C 12/04/14 1824  Arby Barrette, MD 12/05/14 [phone]

## 2014-12-04 NOTE — ED Notes (Signed)
Pt c/o low back pain since he fell while playing basketball last night. R/hand injury-5th finger swollen, injured during game

## 2015-09-30 ENCOUNTER — Ambulatory Visit: Payer: Self-pay

## 2015-09-30 ENCOUNTER — Other Ambulatory Visit: Payer: Self-pay | Admitting: Occupational Medicine

## 2015-09-30 DIAGNOSIS — Z0000 Encounter for general adult medical examination without abnormal findings: Secondary | ICD-10-CM

## 2016-07-14 IMAGING — CR DG HAND COMPLETE 3+V*R*
3 series · 3 of 3 positions shown · non-contrast
Comparison: Right wrist radiographs performed 03/01/2012

CLINICAL DATA: Injury to the right fifth finger while playing
basketball. Slight deformity at the distal interphalangeal joint.
Initial encounter.

EXAM:
RIGHT HAND - COMPLETE 3+ VIEW

[x hand pa right]
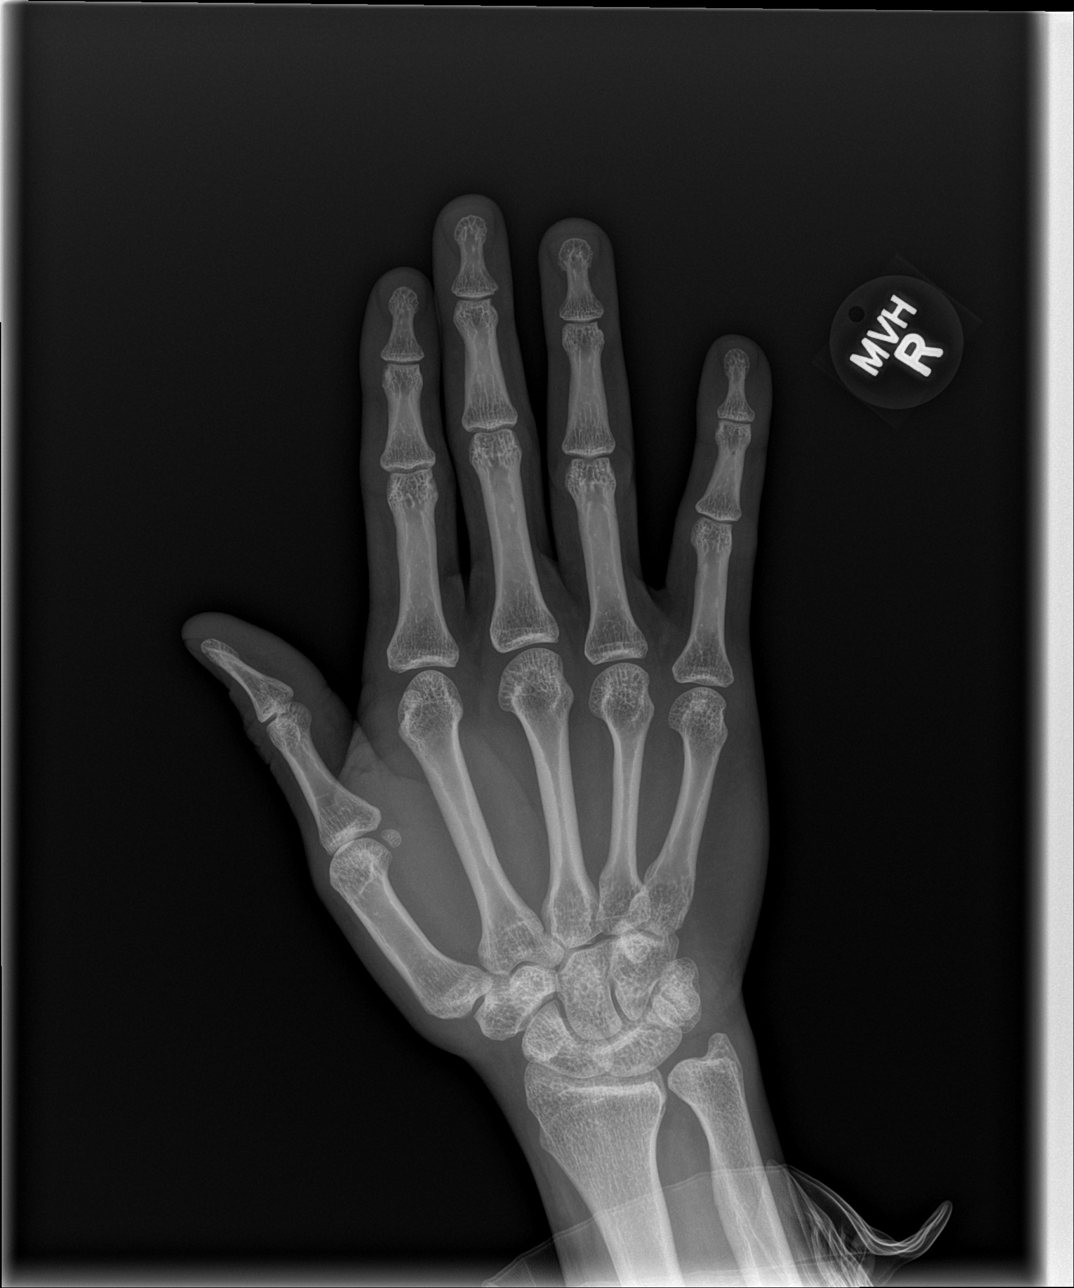

[x hand obl right]
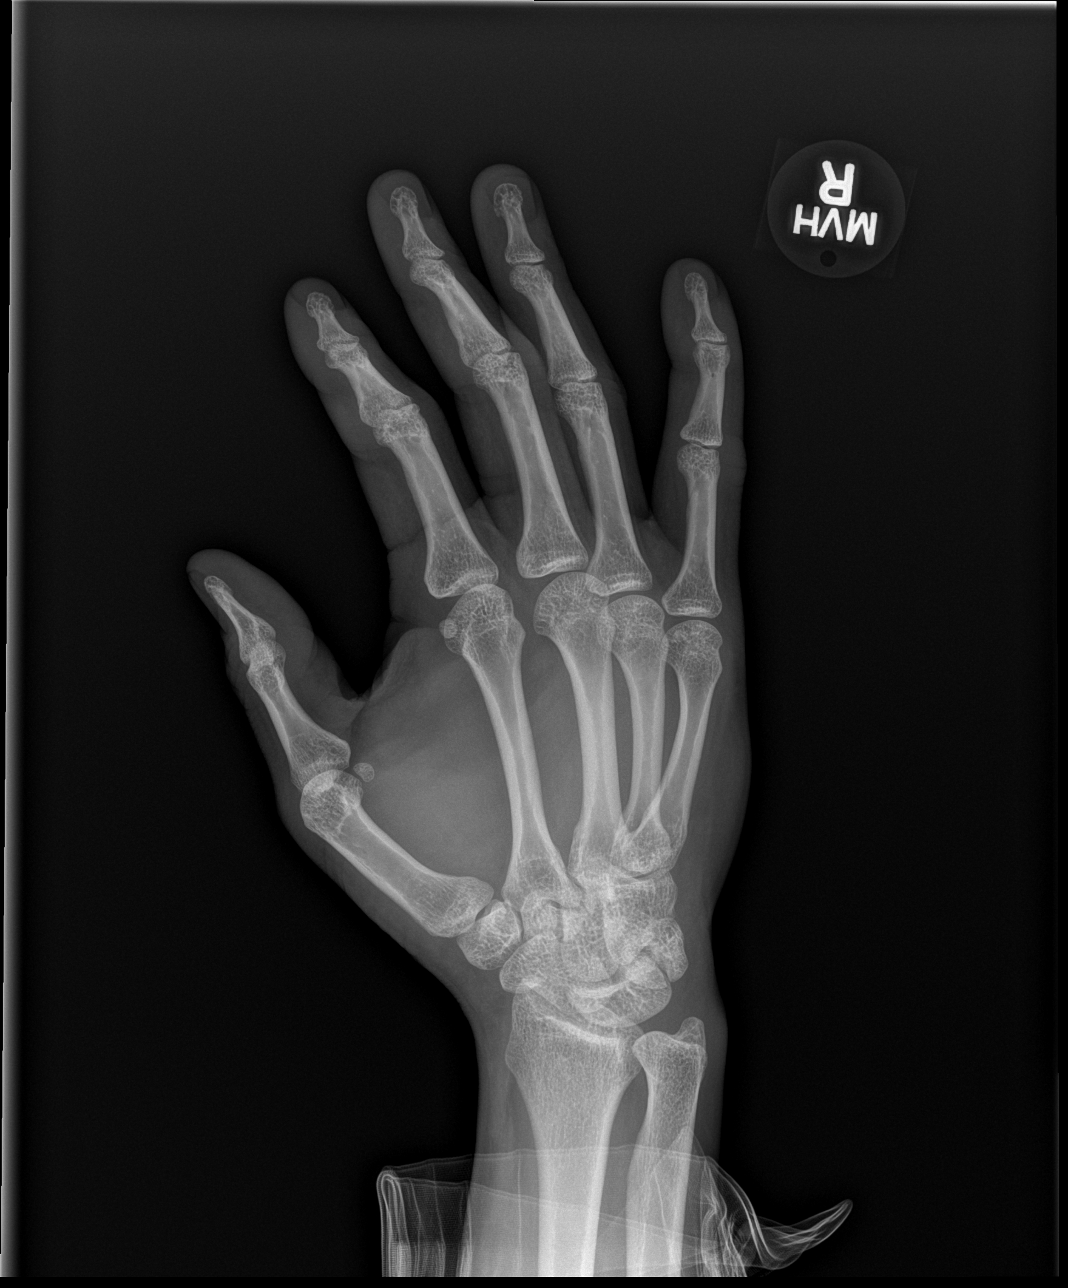

[x hand lat right]
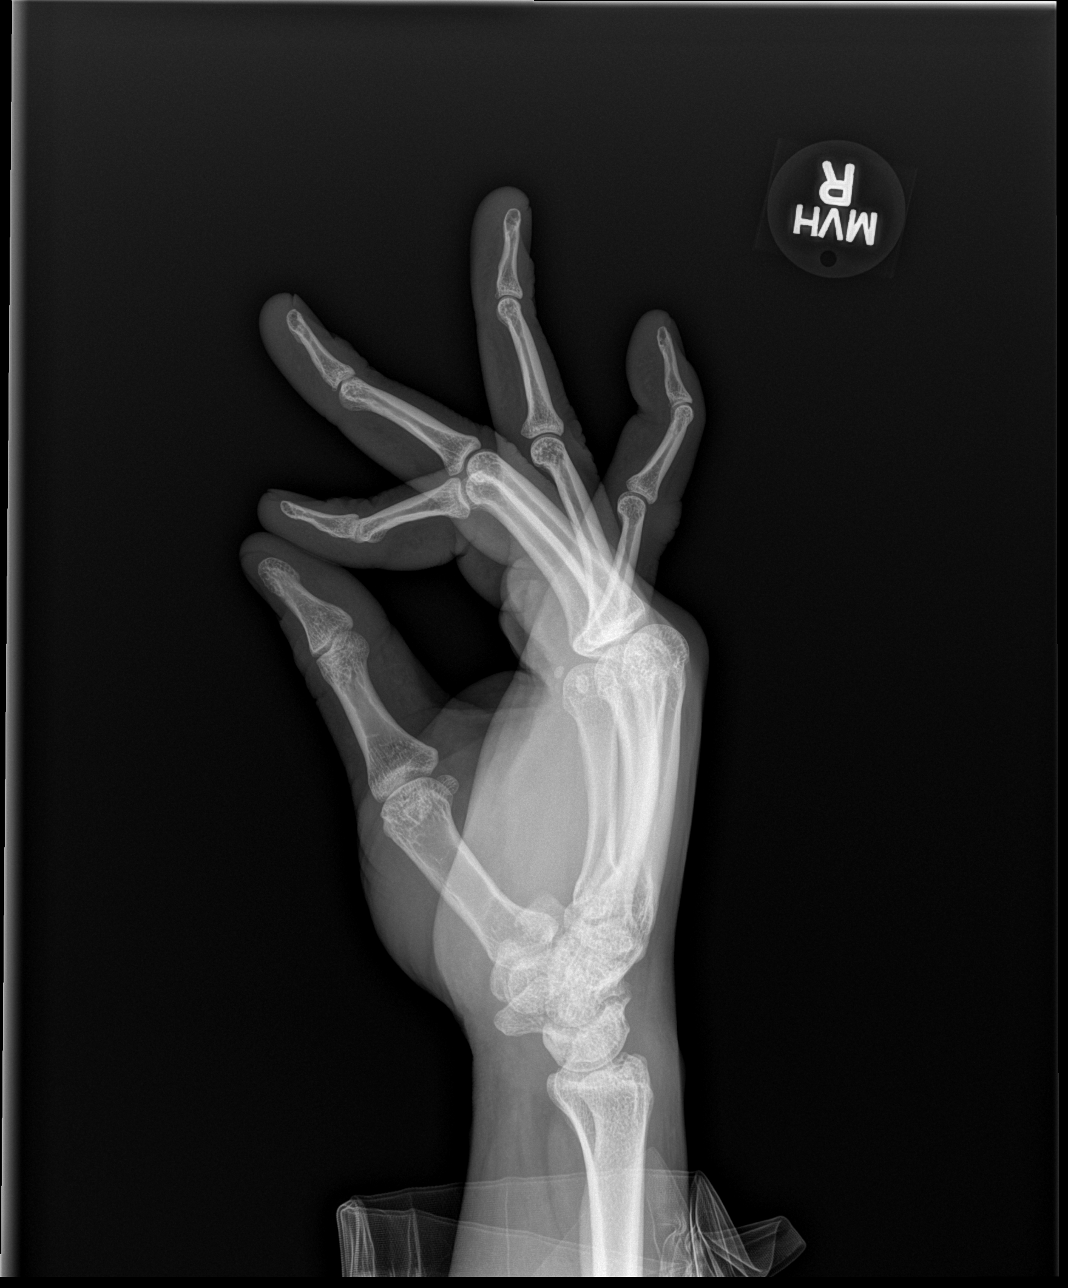

[3 of 3 positions shown; findings below may reference images not displayed]

FINDINGS: There is no evidence of fracture or dislocation. The joint spaces
are preserved. The carpal rows are intact, and demonstrate normal
alignment. The soft tissues are unremarkable in appearance.
IMPRESSION: No evidence of fracture or dislocation.

## 2019-06-05 ENCOUNTER — Emergency Department (HOSPITAL_COMMUNITY): Payer: 59

## 2019-06-05 ENCOUNTER — Other Ambulatory Visit: Payer: Self-pay

## 2019-06-05 ENCOUNTER — Encounter (HOSPITAL_COMMUNITY): Payer: Self-pay | Admitting: *Deleted

## 2019-06-05 ENCOUNTER — Emergency Department (HOSPITAL_COMMUNITY)
Admission: EM | Admit: 2019-06-05 | Discharge: 2019-06-05 | Disposition: A | Discharge status: Home / Self Care | Payer: 59 | Attending: Emergency Medicine | Admitting: Emergency Medicine

## 2019-06-05 DIAGNOSIS — Y999 Unspecified external cause status: Secondary | ICD-10-CM | POA: Insufficient documentation

## 2019-06-05 DIAGNOSIS — Y929 Unspecified place or not applicable: Secondary | ICD-10-CM | POA: Diagnosis not present

## 2019-06-05 DIAGNOSIS — W1830XA Fall on same level, unspecified, initial encounter: Secondary | ICD-10-CM | POA: Diagnosis not present

## 2019-06-05 DIAGNOSIS — Y939 Activity, unspecified: Secondary | ICD-10-CM | POA: Insufficient documentation

## 2019-06-05 DIAGNOSIS — S59911A Unspecified injury of right forearm, initial encounter: Secondary | ICD-10-CM | POA: Diagnosis present

## 2019-06-05 DIAGNOSIS — S40021A Contusion of right upper arm, initial encounter: Secondary | ICD-10-CM | POA: Insufficient documentation

## 2019-06-05 MED ORDER — ACETAMINOPHEN 500 MG PO TABS
1000.0000 mg | ORAL_TABLET | Freq: Once | ORAL | Status: AC
  Administered 2019-06-05: 1000 mg via ORAL
  Filled 2019-06-05: qty 2

## 2019-06-05 NOTE — Discharge Instructions (Signed)
Use ice, tylenol and motrin for pain,. Work note for today.  Gradually increase use of arm as tolerated.

## 2019-06-05 NOTE — ED Triage Notes (Signed)
Pt reports falling on Saturday and having right elbow pain. No obv injury noted.

## 2019-06-05 NOTE — ED Provider Notes (Signed)
[facility] EMERGENCY DEPARTMENT Provider Note   CSN: [id number] Arrival date & time: 06/05/19  0915     History Chief Complaint  Patient presents with  . Fall  . Arm Pain    Bryan Cobb is a 44 y.o. male.  Patient presents with right elbow pain since a fall on Saturday.  No other injuries.  Pain with movement.  Patient denies significant medical history.        History reviewed. No pertinent past medical history.  There are no problems to display for this patient.   History reviewed. No pertinent surgical history.     Family History  Problem Relation Age of Onset  . Hypertension Father     Social History   Tobacco Use  . Smoking status: Never Smoker  Substance Use Topics  . Alcohol use: No  . Drug use: No    Home Medications Prior to Admission medications   Medication Sig Start Date End Date Taking? Authorizing Provider  HYDROcodone-acetaminophen (NORCO/VICODIN) 5-325 MG per tablet Take 2 tablets by mouth every 4 (four) hours as needed for moderate pain or severe pain. 12/04/14   Trixie Dredge, PA-C  ibuprofen (ADVIL,MOTRIN) 800 MG tablet Take 1 tablet (800 mg total) by mouth every 8 (eight) hours as needed for mild pain or moderate pain. 12/04/14   Trixie Dredge, PA-C  traMADol (ULTRAM) 50 MG tablet Take 50 mg by mouth every 6 (six) hours as needed.    [provider]    Allergies    Patient has no known allergies.  Review of Systems   Review of Systems  Constitutional: Negative for fever.  Musculoskeletal: Positive for joint swelling.  Skin: Negative for rash.  Neurological: Negative for weakness and numbness.    Physical Exam Updated Vital Signs BP (!) 142/101 (BP Location: Left Arm)   Pulse 87   Temp 98.2 F (36.8 C) (Oral)   Resp 14   SpO2 97%   Physical Exam Vitals and nursing note reviewed.  Constitutional:      Appearance: He is well-developed.  HENT:     Head: Normocephalic and atraumatic.  Eyes:    General:        Right eye: No discharge.        Left eye: No discharge.  Neck:     Trachea: No tracheal deviation.  Cardiovascular:     Rate and Rhythm: Normal rate.  Pulmonary:     Effort: Pulmonary effort is normal.  Abdominal:     General: There is no distension.     Palpations: Abdomen is soft.     Tenderness: There is no guarding.  Musculoskeletal:     Cervical back: Normal range of motion.     Comments: Patient has tenderness to proximal right ulna near the elbow on the right arm.  Compartment soft.  Full range of motion of wrist forearm and right elbow with minimal discomfort on palpation.  Minimal swelling.  Skin:    General: Skin is warm.     Findings: No rash.  Neurological:     Mental Status: He is alert and oriented to person, place, and time.     ED Results / Procedures / Treatments   Labs (all labs ordered are listed, but only abnormal results are displayed) Labs Reviewed - No data to display  EKG None  Radiology DG Elbow Complete Right  Result Date: 06/05/2019 CLINICAL DATA:  Pain following fall EXAM: RIGHT ELBOW - COMPLETE 3+ VIEW COMPARISON:  None. FINDINGS: Frontal, lateral, and bilateral oblique views were obtained. There is no evident fracture or dislocation. No joint effusion. Joint spaces appear normal. No erosive change. There is a bone island in the proximal radius. IMPRESSION: No evident fracture or dislocation.  No appreciable arthropathy. Electronically Signed   By: Lowella Grip III M.D.   On: 06/05/2019 10:30    Procedures Procedures (including critical care time)  Medications Ordered in ED Medications  acetaminophen (TYLENOL) tablet 1,000 mg (has no administration in time range)    ED Course  I have reviewed the triage vital signs and the nursing notes.  Pertinent labs & imaging results that were available during my care of the patient were reviewed by me and considered in my medical decision making (see chart for  details).  Clinical Course as of Jun 05 1131  Mon Jun 05, 2019  1047 DG Elbow Complete Right [EU]    Clinical Course User Index [EU] Willaim Rayas, Talitha Givens   MDM Rules/Calculators/A&P                     Patient presents with right ulna injury.  X-ray reviewed no acute fracture.  Supportive care discussed and outpatient follow-up. Final Clinical Impression(s) / ED Diagnoses Final diagnoses:  Arm contusion, right, initial encounter    Rx / DC Orders ED Discharge Orders    None       Elnora Morrison, MD 06/05/19 1134

## 2020-02-05 ENCOUNTER — Ambulatory Visit
Admission: EM | Admit: 2020-02-05 | Discharge: 2020-02-05 | Disposition: A | Discharge status: Home / Self Care | Payer: 59 | Attending: Family Medicine | Admitting: Family Medicine

## 2020-02-05 ENCOUNTER — Other Ambulatory Visit: Payer: Self-pay

## 2020-02-05 ENCOUNTER — Encounter: Payer: Self-pay | Admitting: *Deleted

## 2020-02-05 DIAGNOSIS — Z20822 Contact with and (suspected) exposure to covid-19: Secondary | ICD-10-CM | POA: Diagnosis not present

## 2020-02-05 NOTE — ED Triage Notes (Signed)
Positive COVID exposure on Saturday. Would like testing. No symptoms.   Has not received COVID19 vaccine.

## 2020-02-05 NOTE — Discharge Instructions (Signed)

## 2020-02-07 LAB — NOVEL CORONAVIRUS, NAA: SARS-CoV-2, NAA: NOT DETECTED

## 2020-02-07 LAB — SARS-COV-2, NAA 2 DAY TAT

## 2021-01-13 IMAGING — CR DG ELBOW COMPLETE 3+V*R*
2 series · 2 of 2 positions shown · non-contrast
Comparison: None.

CLINICAL DATA: Pain following fall

EXAM:
RIGHT ELBOW - COMPLETE 3+ VIEW

[elbow ap]
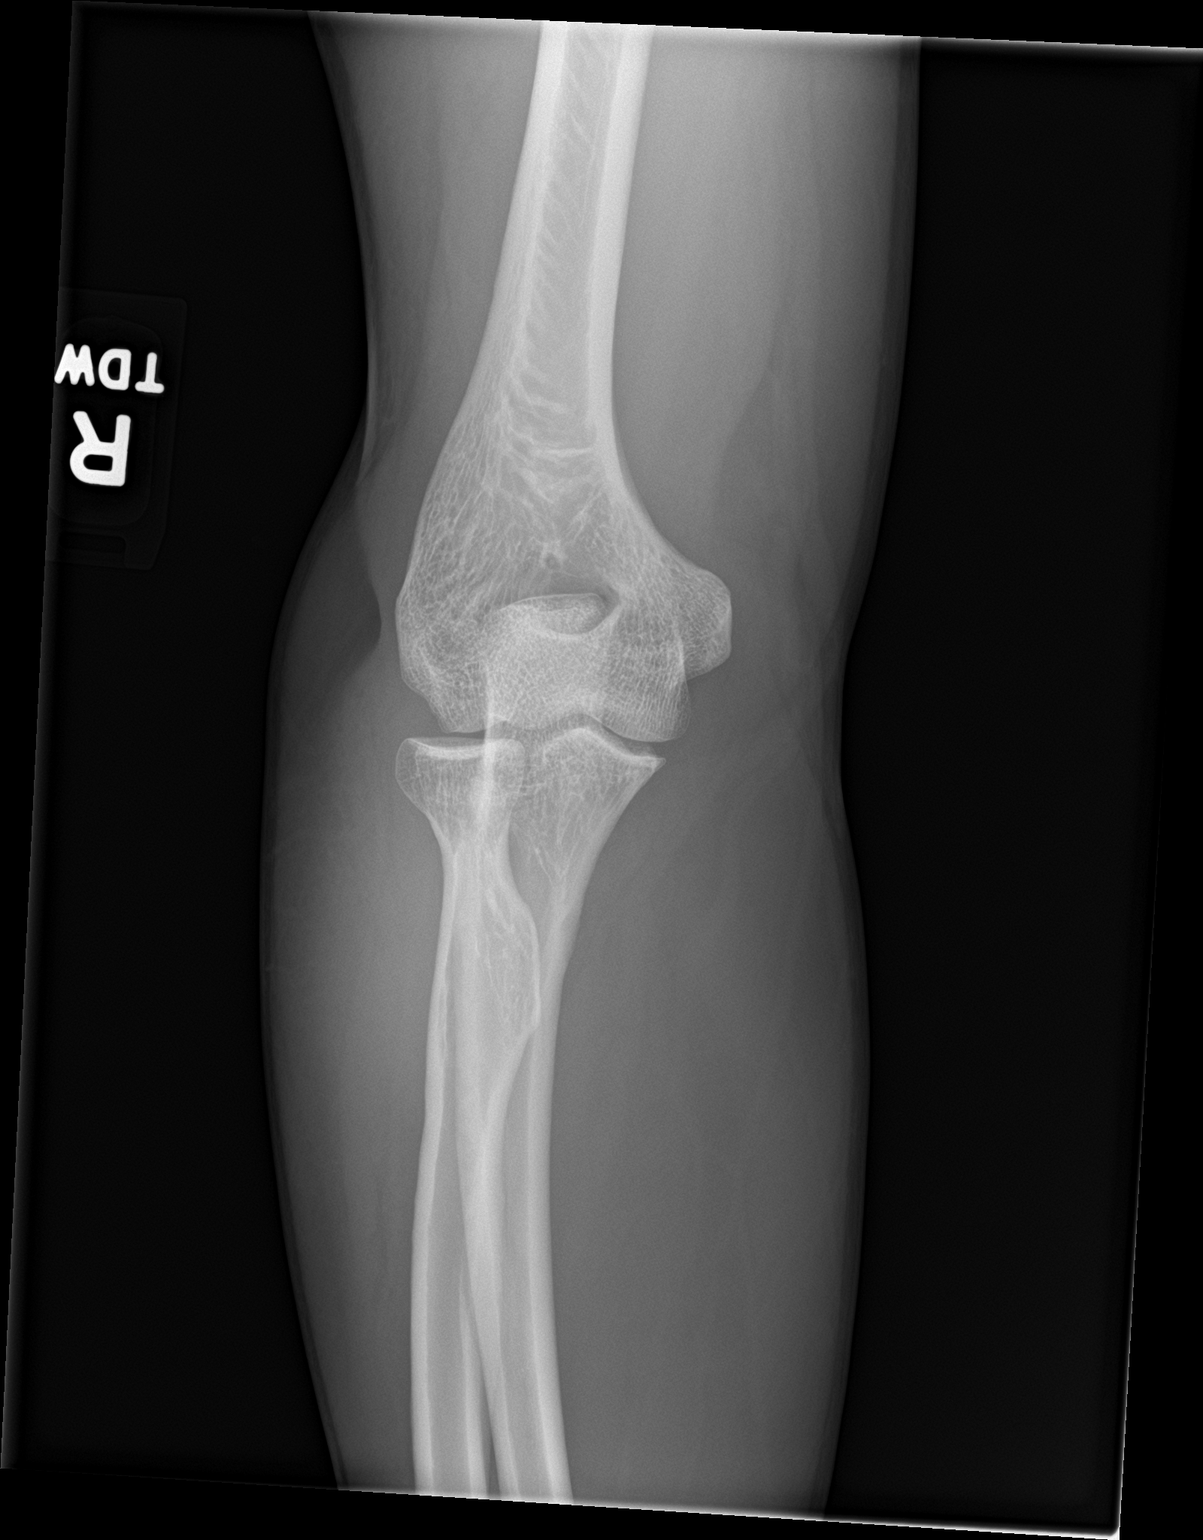

[elbow obl]
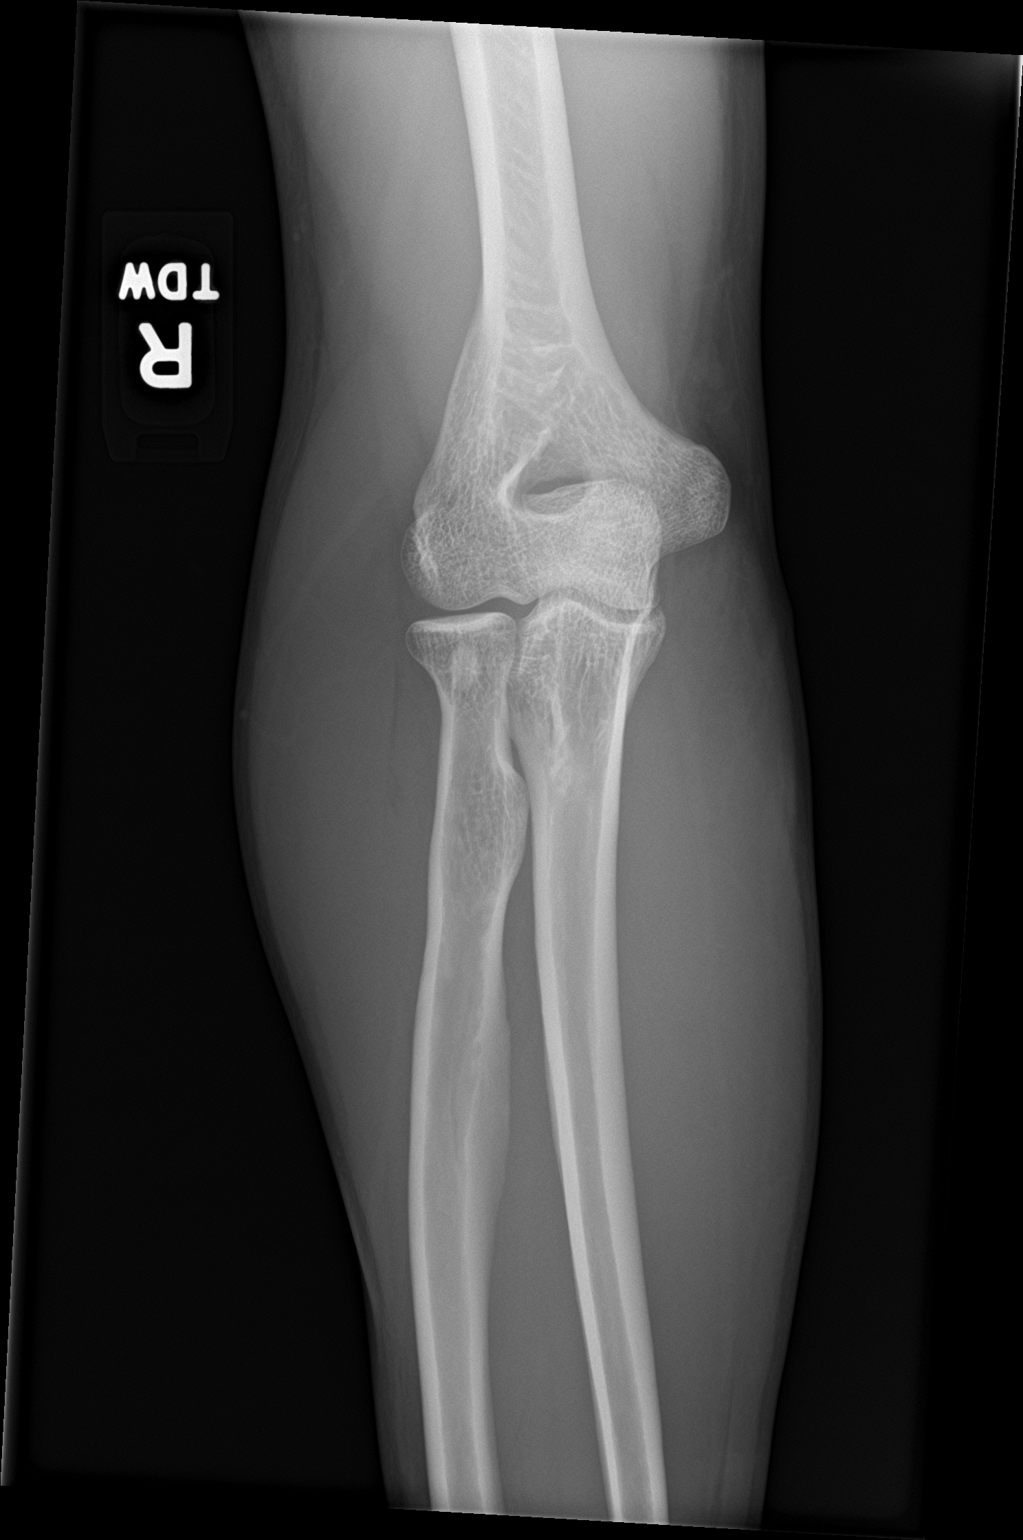

[2 of 2 positions shown; findings below may reference images not displayed]

FINDINGS: Frontal, lateral, and bilateral oblique views were obtained. There
is no evident fracture or dislocation. No joint effusion. Joint
spaces appear normal. No erosive change. There is a bone island in
the proximal radius.
IMPRESSION: No evident fracture or dislocation.  No appreciable arthropathy.

## 2021-09-01 ENCOUNTER — Other Ambulatory Visit: Payer: Self-pay | Admitting: Sports Medicine

## 2021-09-01 ENCOUNTER — Ambulatory Visit
Admission: RE | Admit: 2021-09-01 | Discharge: 2021-09-01 | Disposition: A | Discharge status: Home / Self Care | Payer: 59 | Source: Ambulatory Visit | Attending: Sports Medicine | Admitting: Sports Medicine

## 2021-09-01 DIAGNOSIS — R52 Pain, unspecified: Secondary | ICD-10-CM

## 2021-12-19 ENCOUNTER — Ambulatory Visit
Admission: EM | Admit: 2021-12-19 | Discharge: 2021-12-19 | Disposition: A | Discharge status: Home / Self Care | Payer: BC Managed Care – PPO | Attending: Physician Assistant | Admitting: Physician Assistant

## 2021-12-19 ENCOUNTER — Ambulatory Visit (INDEPENDENT_AMBULATORY_CARE_PROVIDER_SITE_OTHER): Payer: BC Managed Care – PPO

## 2021-12-19 DIAGNOSIS — S0993XA Unspecified injury of face, initial encounter: Secondary | ICD-10-CM | POA: Diagnosis not present

## 2021-12-19 DIAGNOSIS — H05229 Edema of unspecified orbit: Secondary | ICD-10-CM | POA: Diagnosis not present

## 2021-12-19 DIAGNOSIS — R600 Localized edema: Secondary | ICD-10-CM | POA: Diagnosis not present
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
## 2021-12-19 NOTE — Discharge Instructions (Signed)
  No fractures noted on xray.   Please report to ED if symptoms do not improve with time or worsen as CT may be indicated.   Try ibuprofen and/or tylenol if needed for pain.
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
## 2022-03-23 ENCOUNTER — Emergency Department (HOSPITAL_COMMUNITY)
Admission: EM | Admit: 2022-03-23 | Discharge: 2022-03-23 | Discharge status: Against Medical Advice | Payer: Self-pay | Attending: Emergency Medicine | Admitting: Emergency Medicine

## 2022-03-23 ENCOUNTER — Emergency Department (HOSPITAL_COMMUNITY): Payer: Self-pay

## 2022-03-23 ENCOUNTER — Ambulatory Visit
Admission: EM | Admit: 2022-03-23 | Discharge: 2022-03-23 | Disposition: A | Discharge status: Home / Self Care | Payer: Self-pay | Attending: Internal Medicine | Admitting: Internal Medicine

## 2022-03-23 ENCOUNTER — Encounter (HOSPITAL_COMMUNITY): Payer: Self-pay

## 2022-03-23 ENCOUNTER — Other Ambulatory Visit: Payer: Self-pay

## 2022-03-23 DIAGNOSIS — I10 Essential (primary) hypertension: Secondary | ICD-10-CM | POA: Diagnosis not present

## 2022-03-23 DIAGNOSIS — M62838 Other muscle spasm: Secondary | ICD-10-CM

## 2022-03-23 DIAGNOSIS — M542 Cervicalgia: Secondary | ICD-10-CM | POA: Insufficient documentation

## 2022-03-23 DIAGNOSIS — Z5321 Procedure and treatment not carried out due to patient leaving prior to being seen by health care provider: Secondary | ICD-10-CM | POA: Insufficient documentation

## 2022-03-23 MED ORDER — OXYCODONE-ACETAMINOPHEN 5-325 MG PO TABS
1.0000 | ORAL_TABLET | Freq: Once | ORAL | Status: AC
  Administered 2022-03-23: 1 via ORAL
  Filled 2022-03-23: qty 1

## 2022-03-23 MED ORDER — TRAMADOL HCL 50 MG PO TABS: 50.0000 mg | ORAL_TABLET | Freq: Four times a day (QID) | ORAL | 0 refills | Status: AC | PRN

## 2022-03-23 MED ORDER — KETOROLAC TROMETHAMINE 30 MG/ML IJ SOLN
30.0000 mg | Freq: Once | INTRAMUSCULAR | Status: AC
  Administered 2022-03-23: 30 mg via INTRAMUSCULAR

## 2022-03-23 MED ORDER — METHOCARBAMOL 500 MG PO TABS: 500.0000 mg | ORAL_TABLET | Freq: Two times a day (BID) | ORAL | 0 refills | Status: AC

## 2022-03-23 NOTE — ED Provider Notes (Signed)
[facility]    CSN: [id number] Arrival date & time: 03/23/22  1639      History   Chief Complaint Chief Complaint  Patient presents with   Neck Pain    HPI Bryan Cobb is a 46 y.o. male comes to urgent care with sudden onset left-sided severe neck pain which started this morning.  Pain is sharp, severe, aggravated by movement and denies any relieving factors.  Patient was seen in the emergency room in the early hours of this morning.  X-rays of the cervical spine was negative for fracture.  Patient denies any relieving factors.  He was given a dose of narcotics in the emergency department which helped for a while.  He took some muscle relaxants he had at home but that did not help with the neck pain.  Patient says the neck pain is associated with spasms.  Pain is not radiating into both arms.  No weakness in both upper extremities.  Patient denies any trauma to the neck.  He denies any falls.  Patient denies any headaches. HPI  History reviewed. No pertinent past medical history.  There are no problems to display for this patient.   History reviewed. No pertinent surgical history.     Home Medications    Prior to Admission medications   Medication Sig Start Date End Date Taking? Authorizing Provider  methocarbamol (ROBAXIN) 500 MG tablet Take 1 tablet (500 mg total) by mouth 2 (two) times daily. 03/23/22  Yes Esme Freund, Myrene Galas, MD  traMADol (ULTRAM) 50 MG tablet Take 1 tablet (50 mg total) by mouth every 6 (six) hours as needed. 03/23/22  Yes Tahlia Deamer, Myrene Galas, MD    Family History Family History  Problem Relation Age of Onset   Hypertension Father     Social History Social History   Tobacco Use   Smoking status: Never  Vaping Use   Vaping Use: Never used  Substance Use Topics   Alcohol use: No   Drug use: No     Allergies   Patient has no known allergies.   Review of Systems Review of Systems As per HPI  Physical Exam Triage  Vital Signs ED Triage Vitals  Enc Vitals Group     BP 03/23/22 1740 (!) 158/107     Pulse Rate 03/23/22 1740 90     Resp 03/23/22 1740 18     Temp 03/23/22 1740 98 F (36.7 C)     Temp Source 03/23/22 1740 Oral     SpO2 03/23/22 1740 95 %     Weight --      Height --      Head Circumference --      Peak Flow --      Pain Score 03/23/22 1738 10     Pain Loc --      Pain Edu? --      Excl. in [place]? --    No data found.  Updated Vital Signs BP (!) 158/107 (BP Location: Right Arm)   Pulse 90   Temp 98 F (36.7 C) (Oral)   Resp 18   SpO2 95%   Visual Acuity Right Eye Distance:   Left Eye Distance:   Bilateral Distance:    Right Eye Near:   Left Eye Near:    Bilateral Near:     Physical Exam Vitals and nursing note reviewed.  Constitutional:      General: He is in acute distress.     Appearance: He is  not ill-appearing.  Neck:     Comments: Tenderness over the left trapezius muscle as well as left cervical muscles. Cardiovascular:     Rate and Rhythm: Normal rate and regular rhythm.  Musculoskeletal:     Cervical back: Neck supple. Tenderness present.  Lymphadenopathy:     Cervical: No cervical adenopathy.  Skin:    Capillary Refill: Capillary refill takes less than 2 seconds.  Neurological:     General: No focal deficit present.     Mental Status: He is alert and oriented to person, place, and time.     Comments: Power is 5/5 in both upper extremities.  Deep tendon reflexes are 2+ at bicep tendons.      UC Treatments / Results  Labs (all labs ordered are listed, but only abnormal results are displayed) Labs Reviewed - No data to display  EKG   Radiology DG Cervical Spine Complete  Result Date: 03/23/2022 CLINICAL DATA:  Left-sided neck pain and muscle spasms. EXAM: CERVICAL SPINE - COMPLETE 7 VIEW COMPARISON:  None Available. FINDINGS: Prevertebral soft tissues are within normal limits. Vertebral body heights and alignment are normal. Straightening of  the normal cervical lordosis is noted. Foramina are patent bilaterally. No acute or healing fractures are present. The lung apices are clear. IMPRESSION: 1. Straightening of the normal cervical lordosis is likely due to positioning or muscle strain. 2. No acute osseous abnormality. Electronically Signed   By: San Morelle M.D.   On: 03/23/2022 10:08    Procedures Procedures (including critical care time)  Medications Ordered in UC Medications  ketorolac (TORADOL) 30 MG/ML injection 30 mg (30 mg Intramuscular Given 03/23/22 1808)    Initial Impression / Assessment and Plan / UC Course  I have reviewed the triage vital signs and the nursing notes.  Pertinent labs & imaging results that were available during my care of the patient were reviewed by me and considered in my medical decision making (see chart for details).     1.  Cervical paraspinal muscle strain: Toradol 30 mg IM x1 dose Tramadol 50 mg every 6-8 hours as needed for pain Heating pad use only 20 minutes on-20 minutes off cycle recommended Gentle stretching exercises recommended Robaxin 500 mg twice daily-medication precautions given to the patient X-ray done in the emergency department was reviewed with the patient Return precautions given. Final Clinical Impressions(s) / UC Diagnoses   Final diagnoses:  Spasm of cervical paraspinous muscle     Discharge Instructions      Heating pad use on a 20-minute on-20 minutes off cycle Please take medications as prescribed. Please do not drive or operate heavy machinery after taking muscle relaxants and or narcotic medication since they have a potential to make you drowsy. As pain recedes, begin gentle stretching exercises as tolerated Please change your pillow to get better neck support while she is sleeping Your x-rays are negative for any fracture Return to urgent care if you have any other concerns   ED Prescriptions     Medication Sig Dispense Auth. Provider    traMADol (ULTRAM) 50 MG tablet Take 1 tablet (50 mg total) by mouth every 6 (six) hours as needed. 15 tablet Joycelin Radloff, Myrene Galas, MD   methocarbamol (ROBAXIN) 500 MG tablet Take 1 tablet (500 mg total) by mouth 2 (two) times daily. 15 tablet Lindzey Zent, Myrene Galas, MD      I have reviewed the PDMP during this encounter.   Chase Picket, MD 03/23/22 [phone]

## 2022-03-23 NOTE — ED Triage Notes (Signed)
Patient reports that at 0700 he began having left neck spasms which he has a history of. Patient reports that he ran hot water over the area and took pain a muscle relaxant  with no relief.

## 2022-03-23 NOTE — ED Notes (Signed)
Pt sts he is leaving and will go to another [facility] facility tonight

## 2022-03-23 NOTE — ED Provider Triage Note (Addendum)
Emergency Medicine Provider Triage Evaluation Note  Bryan Cobb , a 46 y.o. male  was evaluated in triage.  Pt complains of left side neck pain with muscle spasm, has had similar previously, improved in the past with shower and flexeril which is not helping this time. No trauma, no weakness, no numbness Pain worse with movement of head. Woke up with this pain today. Review of Systems  Positive: As above Negative: As above  Physical Exam  BP (!) 192/106   Pulse 93   Temp 98 F (36.7 C) (Oral)   Resp 20   Ht 5\' 9"  (1.753 m)   Wt 97.1 kg   SpO2 98%   BMI 31.60 kg/m  Gen:   Awake, no distress   Resp:  Normal effort  MSK:   Moves extremities without difficulty  Other:  TTP left trap area  Medical Decision Making  Medically screening exam initiated at 9:07 AM.  Appropriate orders placed.  Bryan Cobb was informed that the remainder of the evaluation will be completed by another provider, this initial triage assessment does not replace that evaluation, and the importance of remaining in the ED until their evaluation is complete.  BP 152/107   Tacy Learn, PA-C 03/23/22 0908    Tacy Learn, PA-C 03/23/22 [phone]

## 2022-03-23 NOTE — ED Triage Notes (Signed)
Pt c/o left neck pain/spasms that started today. Denies trauma/injury/over use. States has not happened before. States was in ED earlier today but left early. Says flexeril does not help. Pt received hydrocodone at ED but has since worn off.

## 2022-03-23 NOTE — Discharge Instructions (Addendum)
Heating pad use on a 20-minute on-20 minutes off cycle Please take medications as prescribed. Please do not drive or operate heavy machinery after taking muscle relaxants and or narcotic medication since they have a potential to make you drowsy. As pain recedes, begin gentle stretching exercises as tolerated Please change your pillow to get better neck support while she is sleeping Your x-rays are negative for any fracture Return to urgent care if you have any other concerns

## 2022-09-10 DIAGNOSIS — Z0000 Encounter for general adult medical examination without abnormal findings: Secondary | ICD-10-CM | POA: Diagnosis not present

## 2022-09-10 DIAGNOSIS — Z125 Encounter for screening for malignant neoplasm of prostate: Secondary | ICD-10-CM | POA: Diagnosis not present

## 2022-09-10 DIAGNOSIS — Z113 Encounter for screening for infections with a predominantly sexual mode of transmission: Secondary | ICD-10-CM | POA: Diagnosis not present

## 2022-09-10 DIAGNOSIS — R5383 Other fatigue: Secondary | ICD-10-CM | POA: Diagnosis not present

## 2022-09-10 DIAGNOSIS — Z79899 Other long term (current) drug therapy: Secondary | ICD-10-CM | POA: Diagnosis not present

## 2022-09-10 DIAGNOSIS — E7800 Pure hypercholesterolemia, unspecified: Secondary | ICD-10-CM | POA: Diagnosis not present

## 2022-09-24 DIAGNOSIS — M25512 Pain in left shoulder: Secondary | ICD-10-CM | POA: Diagnosis not present

## 2023-10-01 DIAGNOSIS — R1032 Left lower quadrant pain: Secondary | ICD-10-CM | POA: Diagnosis not present

## 2023-10-01 DIAGNOSIS — Z79899 Other long term (current) drug therapy: Secondary | ICD-10-CM | POA: Diagnosis not present

## 2023-10-01 DIAGNOSIS — E7800 Pure hypercholesterolemia, unspecified: Secondary | ICD-10-CM | POA: Diagnosis not present

## 2023-10-01 DIAGNOSIS — Z0000 Encounter for general adult medical examination without abnormal findings: Secondary | ICD-10-CM | POA: Diagnosis not present

## 2023-10-01 DIAGNOSIS — Z7251 High risk heterosexual behavior: Secondary | ICD-10-CM | POA: Diagnosis not present

## 2023-10-01 DIAGNOSIS — I10 Essential (primary) hypertension: Secondary | ICD-10-CM | POA: Diagnosis not present

## 2023-10-01 DIAGNOSIS — E559 Vitamin D deficiency, unspecified: Secondary | ICD-10-CM | POA: Diagnosis not present

## 2023-10-01 DIAGNOSIS — Z125 Encounter for screening for malignant neoplasm of prostate: Secondary | ICD-10-CM | POA: Diagnosis not present

## 2023-10-11 ENCOUNTER — Other Ambulatory Visit (HOSPITAL_BASED_OUTPATIENT_CLINIC_OR_DEPARTMENT_OTHER): Payer: Self-pay | Admitting: Internal Medicine

## 2023-10-11 DIAGNOSIS — E7800 Pure hypercholesterolemia, unspecified: Secondary | ICD-10-CM
